# Patient Record
Sex: Female | Born: 1990 | Race: Black or African American | Hispanic: No | Marital: Single
Health system: Midwestern US, Academic
[De-identification: ages and names within clinical notes are randomized; demographics above are authoritative.]

---

## 2013-11-04 ENCOUNTER — Inpatient Hospital Stay
Admit: 2013-11-04 | Discharge: 2013-11-04 | Disposition: A | Discharge status: Home / Self Care | Payer: PRIVATE HEALTH INSURANCE

## 2013-11-04 DIAGNOSIS — O9989 Other specified diseases and conditions complicating pregnancy, childbirth and the puerperium: Secondary | ICD-10-CM

## 2013-11-04 LAB — TRICHOMONAS SCREEN
Clue Cells, Wet Prep: ABSENT
Trich, Wet Prep: ABSENT
Yeast, Wet Prep: ABSENT

## 2013-11-04 LAB — URINALYSIS-MACROSCOPIC W/REFLEX TO MICROSCOPIC
Bilirubin, UA: NEGATIVE
Blood, UA: NEGATIVE
Glucose, UA: NEGATIVE mg/dL
Ketones, UA: NEGATIVE mg/dL
Leukocytes, UA: NEGATIVE
Nitrite, UA: NEGATIVE
Specific Gravity, UA: 1.025 (ref 1.005–1.035)
Urobilinogen, UA: 0.2 EU/dL (ref 0.2–1.0)
pH, UA: 6.5 (ref 5.0–8.0)

## 2013-11-04 LAB — URINE CULTURE

## 2013-11-04 LAB — URINALYSIS, MICROSCOPIC
RBC, UA: 4 /HPF (ref 0–3)
Squam Epithel, UA: 10 /HPF (ref 0–5)
WBC, UA: 5 /HPF (ref 0–5)

## 2013-11-04 LAB — CHLAMYDIA / GONORRHOEAE DNA SWAB
Chlamydia Trachomatis DNA Swab: NEGATIVE
Neisseria gonorrhoeae DNA Swab: NEGATIVE

## 2013-11-04 LAB — HCG URINE, QUALITATIVE: Preg Test, Ur: POSITIVE

## 2013-11-04 MED ORDER — cephALEXin (KEFLEX) 500 MG capsule
500 | ORAL_CAPSULE | Freq: Two times a day (BID) | ORAL | Status: AC
Start: 2013-11-04 — End: 2013-11-11

## 2013-11-04 MED ORDER — cephALEXin (KEFLEX) capsule 500 mg
500 | Freq: Once | ORAL | Status: AC
Start: 2013-11-04 — End: 2013-11-04
  Administered 2013-11-04: 21:00:00 500 mg via ORAL

## 2013-11-04 MED FILL — CEPHALEXIN 500 MG CAPSULE: 500 500 MG | ORAL | Qty: 1

## 2013-11-04 NOTE — Unmapped (Signed)
[place] ED Note    Date of service:  11/04/2013    Reason for Visit: Abdominal Pain      Patient History     HPI    [name] with PMH of PID, multiple UTIs, currently G1P0 at 12wks by U/S presents to the hospital for urinary pain and burning. Patient has had multiple UTIs in the past 6 months, at 6 mos, 3mos and 1.32mos ago for which she took Cipro and symptoms resolved. She states her current symptoms began 4-5 weeks ago and she started taking cranberry and complementary medicines to improve her pain with some effect. 3-4 days ago she began having worsening lower abdominal pain, urge, frequency, dysuria. She reports a single episode of small hematuria. She denies fever, chills, nausea, vomiting, cramping, diarrhea, constipation, CP, SOB. She reports vaginal discharge but says it is not itchy or erythematous.     Previously she was treated in [place] and the clinic and in [place] where she goes to school.    The patient is [redacted] weeks pregnant by ultrasound at 9 weeks.     No past medical history on file.    No past surgical history on file.    Patient  has no tobacco, alcohol, and drug history on file.    Previous Medications    No medications on file     Allergies:   Allergies as of 11/04/2013   ??? (Not on File)     Review of Systems     Review of Systems   Constitutional: Positive for weight gain (Pregnancy). Negative for fever, chills and fatigue.   HENT: Negative for congestion, rhinorrhea and sore throat.    Eyes: Negative for redness and itching.   Respiratory: Negative for cough, shortness of breath and wheezing.    Cardiovascular: Negative for chest pain and palpitations.   Gastrointestinal: Negative for nausea, vomiting, abdominal pain, diarrhea and constipation.   Genitourinary: Positive for dysuria, urgency, frequency, hematuria, vaginal discharge and pelvic pain.   Musculoskeletal: Positive for back pain (Lower at L5/S1). Negative for joint swelling  and neck pain.   Skin: Negative for pallor, rash and wound.   Neurological: Negative for dizziness and headaches.   Hematological: Negative for adenopathy.   Psychiatric/Behavioral: Negative for confusion and agitation.     Physical Exam     ED Triage Vitals   Vital Signs Group      Temp --       Temp src --       Pulse --       Heart Rate Source --       Resp --       SpO2 --       BP --       BP Location --       BP Method --       Patient Position --    SpO2 --    O2 Device --      Physical Exam   Constitutional: She appears well-developed and well-nourished. No distress.   HENT:   Head: Normocephalic and atraumatic.   Right Ear: External ear normal.   Left Ear: External ear normal.   Nose: Nose normal.   Mouth/Throat: Oropharynx is clear and moist. No oropharyngeal exudate.   Eyes: Conjunctivae and EOM are normal. Pupils are equal, round, and reactive to light. Right eye exhibits no discharge. Left eye exhibits no discharge. No scleral icterus.   Neck: Normal range of motion. Neck supple. No  tracheal deviation present. No thyromegaly present.   Cardiovascular: Normal rate, regular rhythm and intact distal pulses.  Exam reveals no gallop and no friction rub.    No murmur heard.  Pulmonary/Chest: Effort normal and breath sounds normal. No stridor. No respiratory distress. She has no wheezes. She has no rales. She exhibits no tenderness.   Abdominal: Soft. Bowel sounds are normal. She exhibits no distension and no mass. There is tenderness (Mildly tender in low abd). There is no rebound and no guarding.   Genitourinary: Pelvic exam was performed with patient supine. No labial fusion. There is no rash, tenderness, lesion or injury on the right labia. There is no rash, tenderness, lesion or injury on the left labia. Uterus is not deviated and not tender. Cervix exhibits no motion tenderness, no discharge and no friability. Right adnexum displays no mass, no tenderness and no fullness. Left adnexum displays no mass,  no tenderness and no fullness. No erythema, tenderness or bleeding around the vagina. No foreign body around the vagina. No signs of injury around the vagina. Vaginal discharge found.   Musculoskeletal: Normal range of motion. She exhibits no edema and no tenderness.   Lymphadenopathy:     She has no cervical adenopathy.   Neurological: She is oriented to person.  Normal speech without aphasia or dysarthria.  Moves all extremities spontaneosly and symmetrically.  Gait is normal without ataxia.    Skin: Skin is warm and dry. No rash noted. She is not diaphoretic. No erythema. No pallor.   Psychiatric: She has a normal mood and affect. Her behavior is normal. Judgment and thought content normal.     Diagnostic Studies     Labs:    Please see electronic medical record for any tests performed in the ED    Radiology:    No radiology performed in the ED.    EKG:    No EKG Performed    Emergency Department Procedures     Procedures    ED Course and MDM     Kayla Lee is a 23 y.o. female who presented to the emergency department with Abdominal Pain    [name] with PMH of multiple UTIs and PId reported previously who is G1P0 at 12 weeks based off U/S at [facility] who presents for urinary pain and urgency. UA, UCx and Upreg were sent. Results of urine studies show LE neg, WBCs 5, RBCs 4, many bacteria and 10 squamous cells. Patient had a pelvic exam performed which demonstrated discharge surrounding the cervix but no erythema, friability or CMT. Patient is afebrile without nausea or vomiting so pyelonephritis is unlikely. Given results of studies UTI seems the likely diagnosis. Patient has been treated with Cipro previously, twice while pregnant. Previous culture are not available for review in our system.  Transabdominal ultrasound performed showing viable intrauterine pregnancy, fetal heart rate 140.     She will be treated with cephalexin and encouraged to called [phone] to get established with an OBGYN.     Critical Care Time  (Attendings)       Marykay Lex, MD  Resident  11/04/13 [phone]

## 2013-11-04 NOTE — Unmapped (Signed)
ED Attending Attestation Note    Date of service:  11/04/2013    This patient was seen by the resident physician.  I have seen and examined the patient, agree with the workup, evaluation, management and diagnosis. The care plan has been discussed and I concur.     My assessment reveals a 23 y.o. female h/o PID, multiple UTIs, currently G1P0 at 12wks by U/S, who self-treated with Cipro 1.5 months ago (says that this was prescribed to her when she didn't know she was pregnant) presents with epigastric pain that is worse with eating, dysuria, and white vaginal discharge off and on. On my exam, abdomen is soft with fundus just above the umbilicus and suprapubic TTP.
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
## 2013-11-04 NOTE — Unmapped (Signed)
To get established with an OB/GYN please call [phone] on Monday and make an appointment.    You have been prescribed cephalexin (Keflex). Please take this medication twice a day for the next 7 days.    If any of your tests are positive we will call you with the results.

## 2013-11-04 NOTE — Unmapped (Signed)
Patient discharged. Verbalized understanding. Alert and oriented x4. Respirations unlabored. Skin warm and dry. Gait steady. Speech clear.

## 2016-12-15 ENCOUNTER — Emergency Department (HOSPITAL_COMMUNITY)
Admission: EM | Admit: 2016-12-15 | Discharge: 2016-12-15 | Disposition: A | Discharge status: Home / Self Care | Payer: 59 | Attending: Emergency Medicine | Admitting: Emergency Medicine

## 2016-12-15 ENCOUNTER — Emergency Department (HOSPITAL_COMMUNITY): Payer: 59

## 2016-12-15 ENCOUNTER — Encounter (HOSPITAL_COMMUNITY): Payer: Self-pay | Admitting: *Deleted

## 2016-12-15 DIAGNOSIS — Y9389 Activity, other specified: Secondary | ICD-10-CM | POA: Insufficient documentation

## 2016-12-15 DIAGNOSIS — Y999 Unspecified external cause status: Secondary | ICD-10-CM | POA: Insufficient documentation

## 2016-12-15 DIAGNOSIS — S0083XA Contusion of other part of head, initial encounter: Secondary | ICD-10-CM | POA: Diagnosis not present

## 2016-12-15 DIAGNOSIS — S63609A Unspecified sprain of unspecified thumb, initial encounter: Secondary | ICD-10-CM

## 2016-12-15 DIAGNOSIS — S66212A Strain of extensor muscle, fascia and tendon of left thumb at wrist and hand level, initial encounter: Secondary | ICD-10-CM | POA: Diagnosis not present

## 2016-12-15 DIAGNOSIS — S60222A Contusion of left hand, initial encounter: Secondary | ICD-10-CM | POA: Insufficient documentation

## 2016-12-15 DIAGNOSIS — S80212A Abrasion, left knee, initial encounter: Secondary | ICD-10-CM | POA: Diagnosis not present

## 2016-12-15 DIAGNOSIS — S161XXA Strain of muscle, fascia and tendon at neck level, initial encounter: Secondary | ICD-10-CM | POA: Insufficient documentation

## 2016-12-15 DIAGNOSIS — Y92410 Unspecified street and highway as the place of occurrence of the external cause: Secondary | ICD-10-CM | POA: Insufficient documentation

## 2016-12-15 DIAGNOSIS — S80211A Abrasion, right knee, initial encounter: Secondary | ICD-10-CM | POA: Diagnosis not present

## 2016-12-15 DIAGNOSIS — F17290 Nicotine dependence, other tobacco product, uncomplicated: Secondary | ICD-10-CM | POA: Insufficient documentation

## 2016-12-15 DIAGNOSIS — IMO0002 Reserved for concepts with insufficient information to code with codable children: Secondary | ICD-10-CM

## 2016-12-15 DIAGNOSIS — S6992XA Unspecified injury of left wrist, hand and finger(s), initial encounter: Secondary | ICD-10-CM | POA: Diagnosis present

## 2016-12-15 MED ORDER — IBUPROFEN 400 MG PO TABS
600.0000 mg | ORAL_TABLET | Freq: Once | ORAL | Status: AC
  Administered 2016-12-15: 15:00:00 600 mg via ORAL
  Filled 2016-12-15: qty 1

## 2016-12-15 MED ORDER — ORPHENADRINE CITRATE ER 100 MG PO TB12: 100.0000 mg | ORAL_TABLET | Freq: Two times a day (BID) | ORAL | 0 refills | Status: AC

## 2016-12-15 MED ORDER — IBUPROFEN 600 MG PO TABS: 600.0000 mg | ORAL_TABLET | Freq: Four times a day (QID) | ORAL | 0 refills | Status: AC | PRN
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
## 2016-12-15 NOTE — ED Notes (Signed)
Pt. returned from XR. 

## 2016-12-15 NOTE — Progress Notes (Signed)
Orthopedic Tech Progress Note Patient Details:  Alexis ContesRaven Ferguson 11/06/1990 [id number]  Ortho Devices Type of Ortho Device: Thumb velcro splint Ortho Device/Splint Interventions: Application   Saul FordyceJennifer C Katarzyna Wolven 12/15/2016, 4:18 PM
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
## 2016-12-15 NOTE — ED Notes (Signed)
Patient transported to X-ray 
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
